# Patient Record
Sex: Male | Born: 1985 | Race: Black or African American | Hispanic: No | Marital: Single | Smoking: Former smoker
Health system: Southern US, Community
[De-identification: ages and names within clinical notes are randomized; demographics above are authoritative.]

## PROBLEM LIST (undated history)

## (undated) DIAGNOSIS — F329 Major depressive disorder, single episode, unspecified: Secondary | ICD-10-CM

## (undated) DIAGNOSIS — I10 Essential (primary) hypertension: Secondary | ICD-10-CM

## (undated) DIAGNOSIS — J45909 Unspecified asthma, uncomplicated: Secondary | ICD-10-CM

## (undated) DIAGNOSIS — F32A Depression, unspecified: Secondary | ICD-10-CM

---

## 2007-11-07 ENCOUNTER — Emergency Department: Payer: Self-pay | Admitting: Internal Medicine

## 2008-08-04 ENCOUNTER — Emergency Department: Payer: Self-pay | Admitting: Emergency Medicine

## 2008-12-27 ENCOUNTER — Emergency Department: Payer: Self-pay | Admitting: Emergency Medicine

## 2008-12-28 ENCOUNTER — Emergency Department: Payer: Self-pay | Admitting: Emergency Medicine

## 2009-03-03 ENCOUNTER — Emergency Department: Payer: Self-pay | Admitting: Emergency Medicine

## 2009-12-07 ENCOUNTER — Emergency Department: Payer: Self-pay | Admitting: Emergency Medicine

## 2010-01-02 ENCOUNTER — Emergency Department: Payer: Self-pay | Admitting: Emergency Medicine

## 2010-04-29 IMAGING — CR DG CHEST 2V
1 series · 2 of 2 positions shown · non-contrast
Comparison: none

REASON FOR EXAM: Chest pain
COMMENTS:

PROCEDURE:     DXR - DXR CHEST PA (OR AP) AND LATERAL  - November 07, 2007  [DATE]
RESULT:     The lung fields are clear. No pneumonia, pneumothorax or pleural
effusion is seen. The heart size is normal. The mediastinal and osseous
structures show no acute changes.

[Series 1: view not recorded · 0.17mm/px · 2 of 2 slices shown]
[im 1/2]
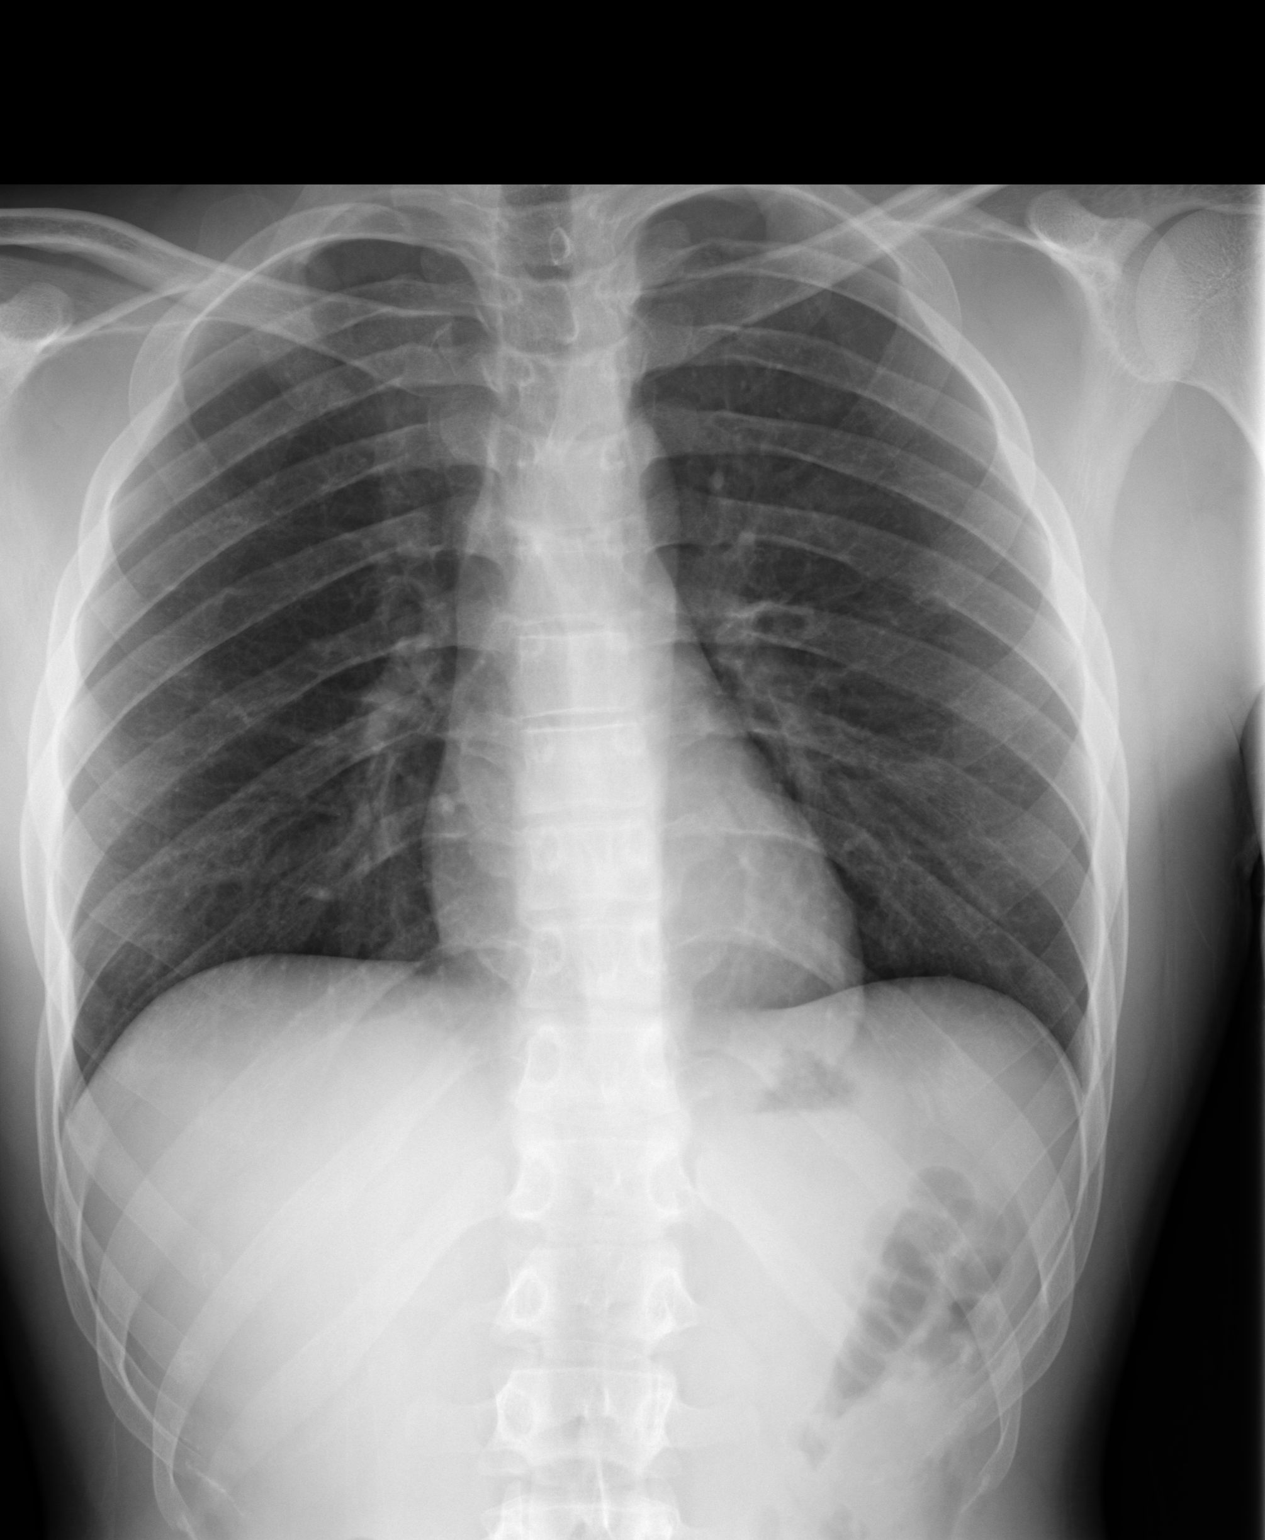
[im 2/2]
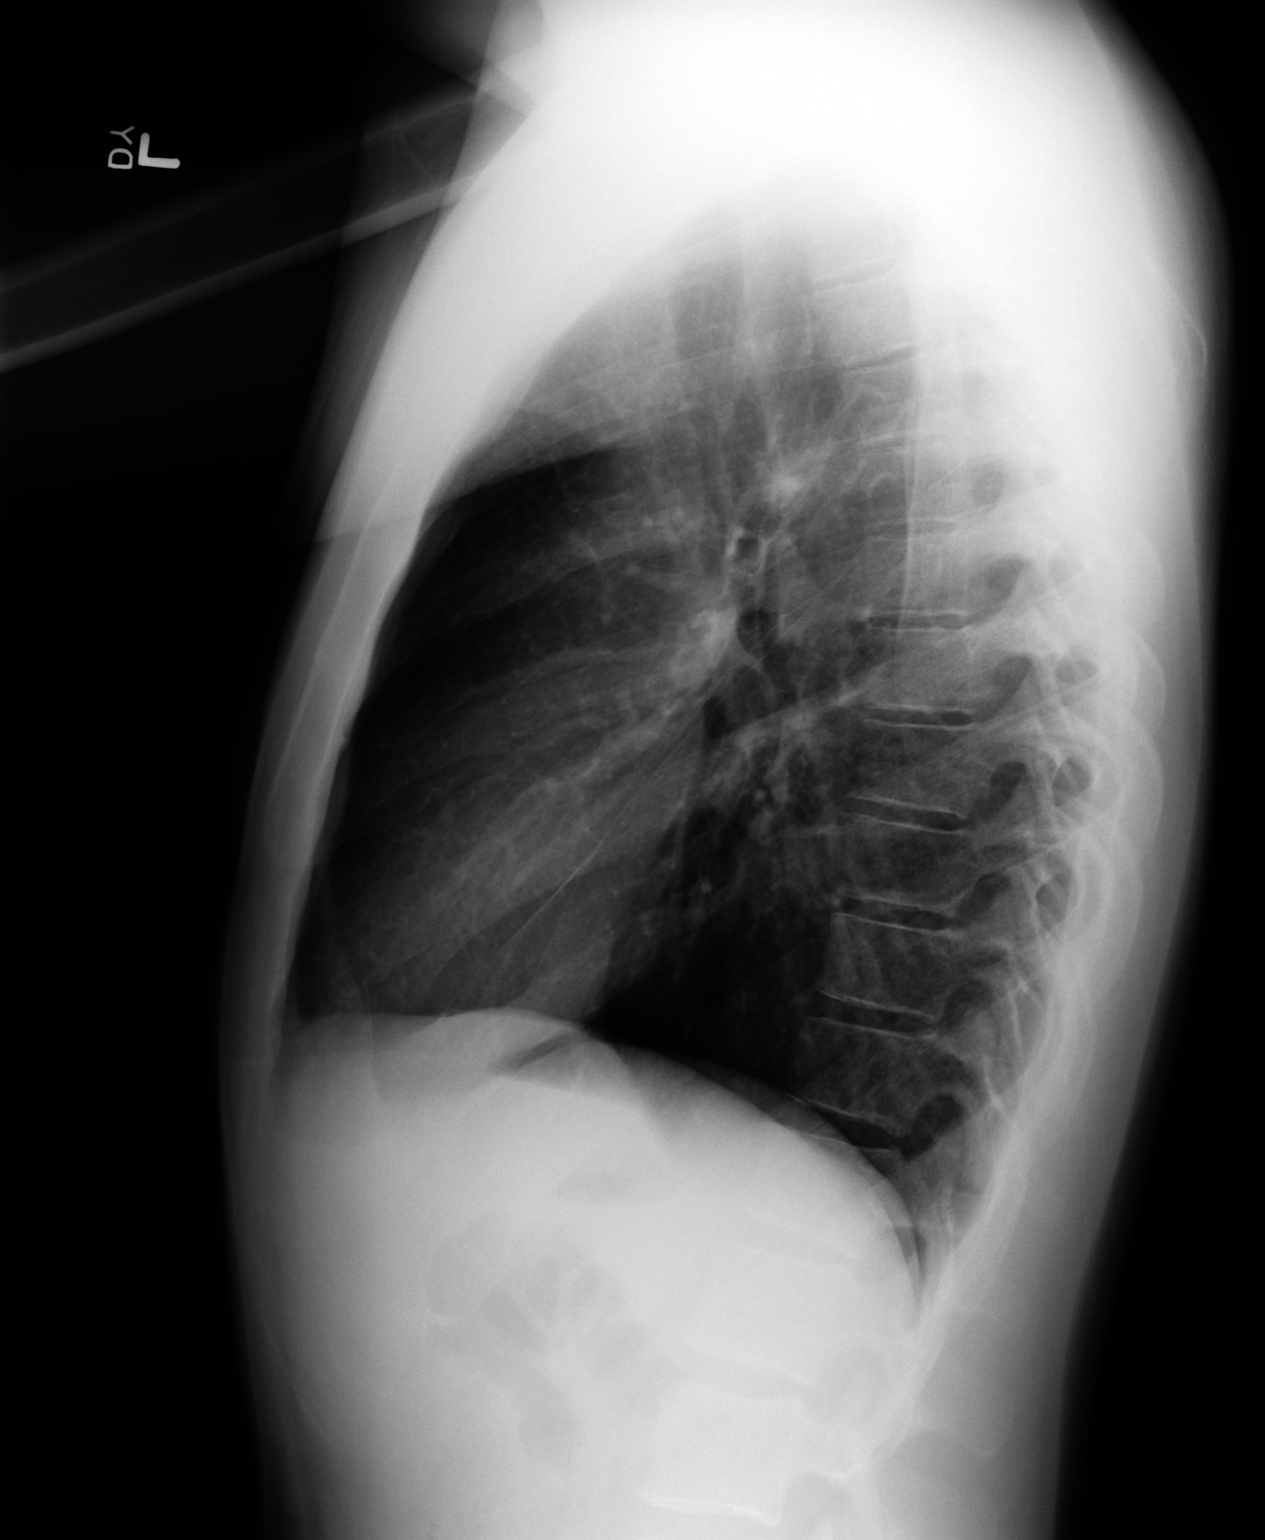

[2 of 2 positions shown; findings below may reference images not displayed]

IMPRESSION: No acute changes are identified.

## 2010-04-29 IMAGING — CR RIGHT ANKLE - COMPLETE 3+ VIEW
1 series · 5 of 5 positions shown · non-contrast
Comparison: none

REASON FOR EXAM: Fall, pain
COMMENTS:

PROCEDURE:     DXR - DXR ANKLE RIGHT COMPLETE  - November 07, 2007  [DATE]
RESULT:     No fracture, dislocation or other acute bony abnormality is
identified. The ankle mortise is well maintained.

[Series 1: view not recorded · 0.17mm/px · 5 of 5 slices shown]
[im 1/5]
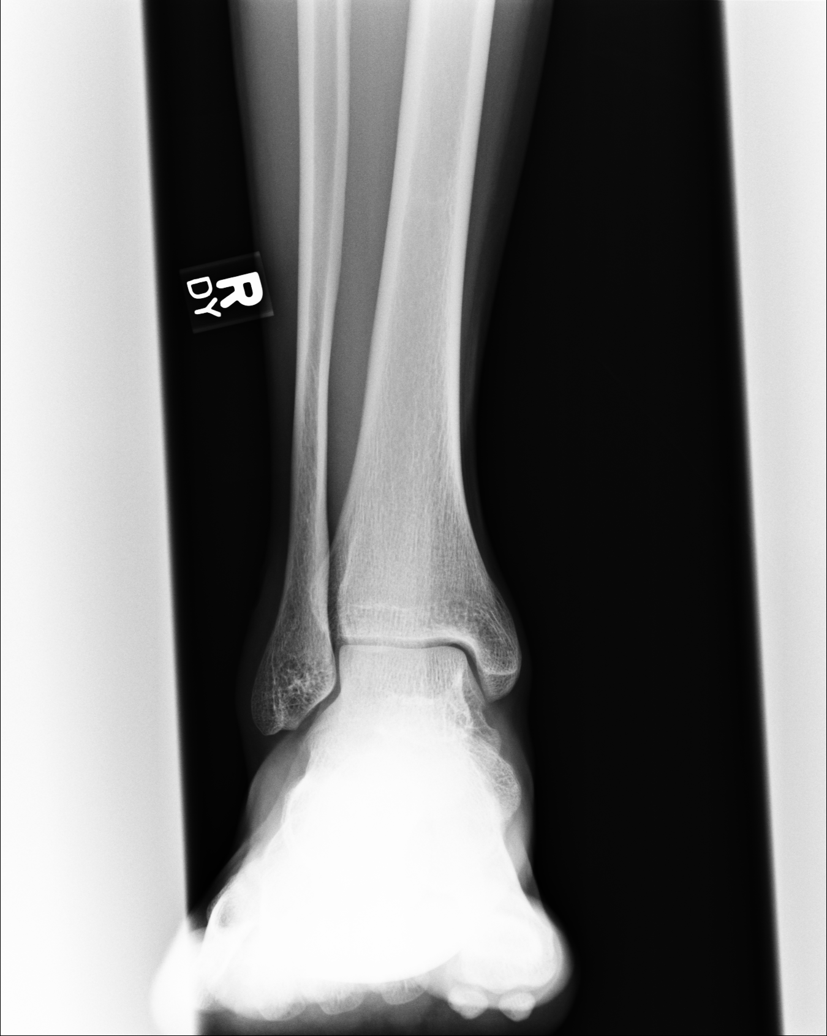
[im 2/5]
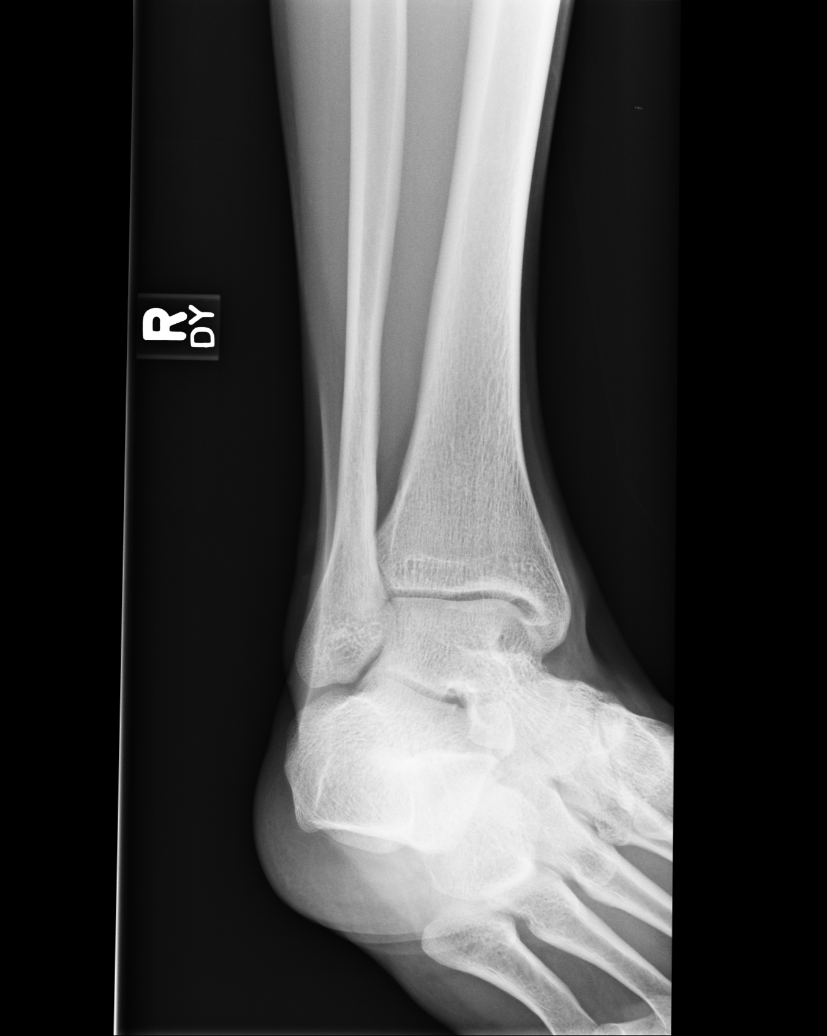
[im 3/5]
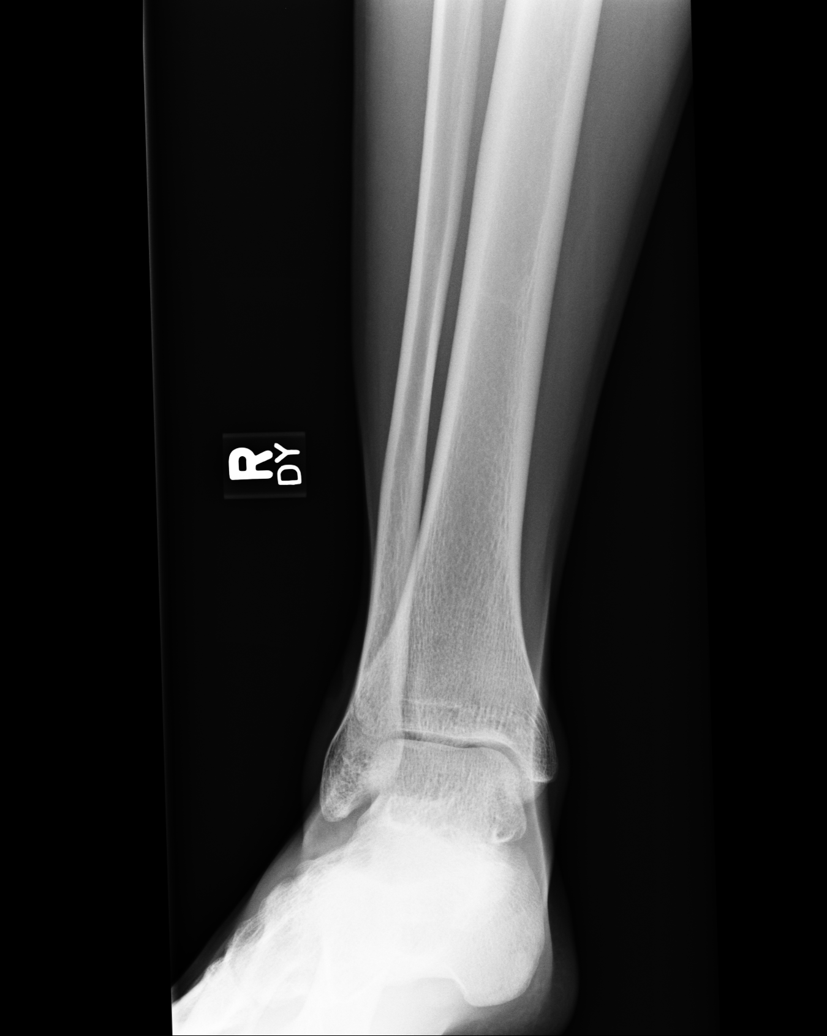
[im 4/5]
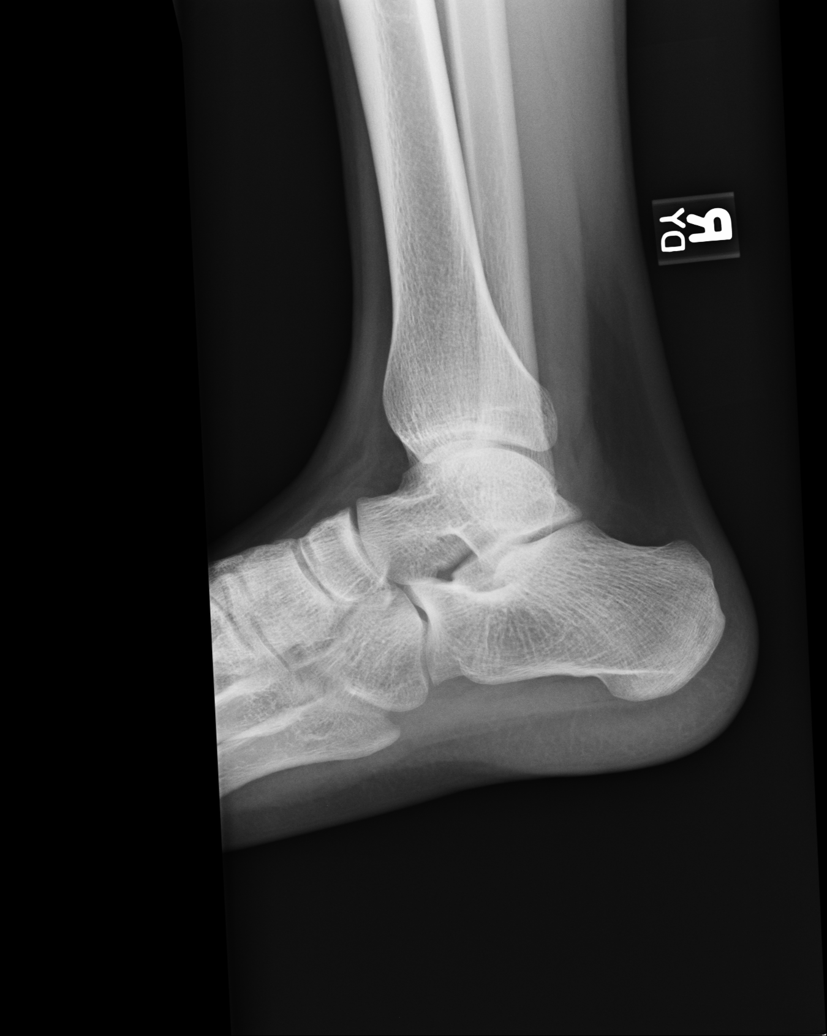
[im 5/5]
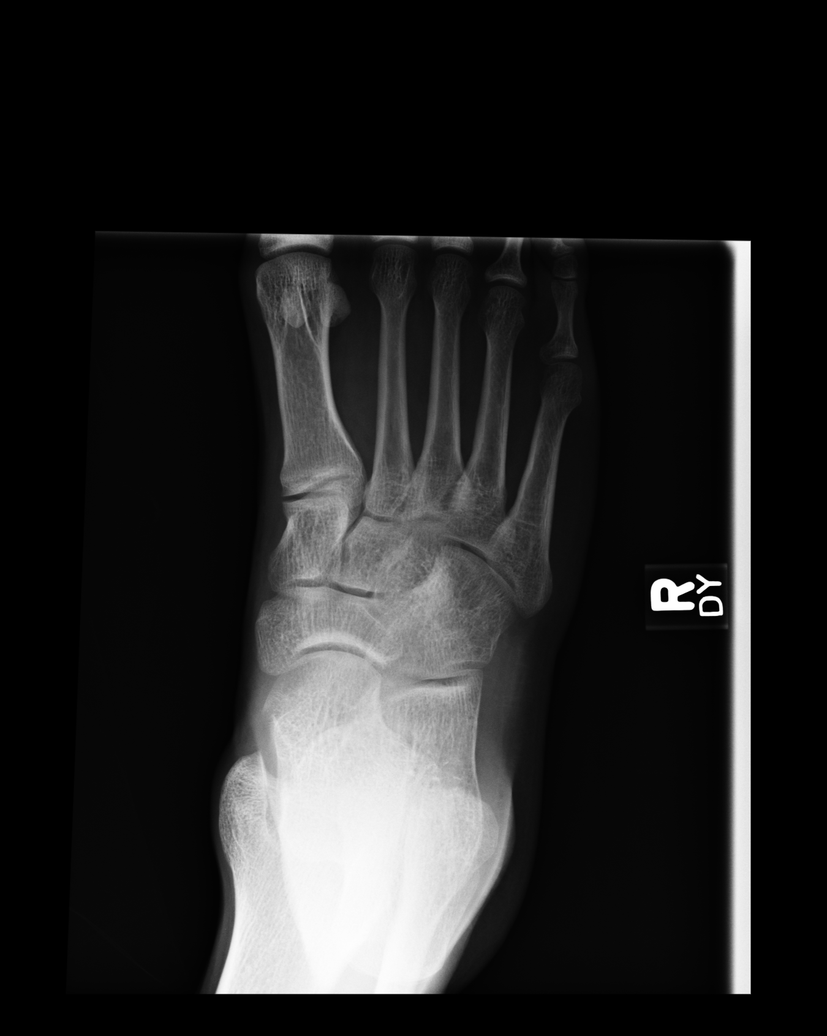

[5 of 5 positions shown; findings below may reference images not displayed]

IMPRESSION: No acute changes are identified.

## 2010-05-22 ENCOUNTER — Emergency Department: Payer: Self-pay | Admitting: Emergency Medicine

## 2010-12-07 ENCOUNTER — Emergency Department: Payer: Self-pay | Admitting: Emergency Medicine

## 2011-07-09 ENCOUNTER — Emergency Department: Payer: Self-pay | Admitting: *Deleted

## 2013-04-08 ENCOUNTER — Emergency Department: Payer: Self-pay | Admitting: Emergency Medicine

## 2013-04-08 LAB — URINALYSIS, COMPLETE
Bacteria: NONE SEEN
Bilirubin,UR: NEGATIVE
Blood: NEGATIVE
Glucose,UR: NEGATIVE mg/dL (ref 0–75)
Ketone: NEGATIVE
LEUKOCYTE ESTERASE: NEGATIVE
Nitrite: NEGATIVE
PROTEIN: NEGATIVE
Ph: 8 (ref 4.5–8.0)
RBC,UR: <1 /HPF (ref 0–5)
SQUAMOUS EPITHELIAL: NONE SEEN
Specific Gravity: 1.01 (ref 1.003–1.030)
WBC UR: 1 /HPF (ref 0–5)

## 2013-04-08 LAB — CBC WITH DIFFERENTIAL/PLATELET
BASOS PCT: 0.6 %
Basophil #: 0.1 10*3/uL (ref 0.0–0.1)
Eosinophil #: 0.2 10*3/uL (ref 0.0–0.7)
Eosinophil %: 2.2 %
HCT: 43.9 % (ref 40.0–52.0)
HGB: 15.1 g/dL (ref 13.0–18.0)
LYMPHS PCT: 26.2 %
Lymphocyte #: 2.6 10*3/uL (ref 1.0–3.6)
MCH: 30.1 pg (ref 26.0–34.0)
MCHC: 34.5 g/dL (ref 32.0–36.0)
MCV: 87 fL (ref 80–100)
Monocyte #: 0.8 x10 3/mm (ref 0.2–1.0)
Monocyte %: 7.5 %
Neutrophil #: 6.4 10*3/uL (ref 1.4–6.5)
Neutrophil %: 63.5 %
Platelet: 137 10*3/uL — ABNORMAL LOW (ref 150–440)
RBC: 5.02 10*6/uL (ref 4.40–5.90)
RDW: 13.1 % (ref 11.5–14.5)
WBC: 10 10*3/uL (ref 3.8–10.6)

## 2013-04-08 LAB — BASIC METABOLIC PANEL
Anion Gap: 3 — ABNORMAL LOW (ref 7–16)
BUN: 12 mg/dL (ref 7–18)
Calcium, Total: 9 mg/dL (ref 8.5–10.1)
Chloride: 104 mmol/L (ref 98–107)
Co2: 28 mmol/L (ref 21–32)
Creatinine: 0.93 mg/dL (ref 0.60–1.30)
EGFR (African American): >60
EGFR (Non-African Amer.): >60
Glucose: 110 mg/dL — ABNORMAL HIGH (ref 65–99)
Osmolality: 270 (ref 275–301)
Potassium: 3.7 mmol/L (ref 3.5–5.1)
Sodium: 135 mmol/L — ABNORMAL LOW (ref 136–145)

## 2013-06-08 ENCOUNTER — Emergency Department: Payer: Self-pay | Admitting: Emergency Medicine

## 2013-06-08 LAB — URINALYSIS, COMPLETE
Bacteria: NONE SEEN
Bilirubin,UR: NEGATIVE
Blood: NEGATIVE
GLUCOSE, UR: NEGATIVE mg/dL (ref 0–75)
Ketone: NEGATIVE
Leukocyte Esterase: NEGATIVE
Nitrite: NEGATIVE
PROTEIN: NEGATIVE
Ph: 8 (ref 4.5–8.0)
RBC,UR: <1 /HPF (ref 0–5)
Specific Gravity: 1.015 (ref 1.003–1.030)

## 2013-06-08 LAB — BASIC METABOLIC PANEL
Anion Gap: 7 (ref 7–16)
BUN: 16 mg/dL (ref 7–18)
CO2: 26 mmol/L (ref 21–32)
CREATININE: 0.91 mg/dL (ref 0.60–1.30)
Calcium, Total: 9.1 mg/dL (ref 8.5–10.1)
Chloride: 105 mmol/L (ref 98–107)
EGFR (African American): >60
EGFR (Non-African Amer.): >60
GLUCOSE: 97 mg/dL (ref 65–99)
Osmolality: 277 (ref 275–301)
POTASSIUM: 3.9 mmol/L (ref 3.5–5.1)
SODIUM: 138 mmol/L (ref 136–145)

## 2013-06-08 LAB — CBC
HCT: 45.6 % (ref 40.0–52.0)
HGB: 16.1 g/dL (ref 13.0–18.0)
MCH: 31.6 pg (ref 26.0–34.0)
MCHC: 35.3 g/dL (ref 32.0–36.0)
MCV: 90 fL (ref 80–100)
Platelet: 128 10*3/uL — ABNORMAL LOW (ref 150–440)
RBC: 5.09 10*6/uL (ref 4.40–5.90)
RDW: 12.7 % (ref 11.5–14.5)
WBC: 9.6 10*3/uL (ref 3.8–10.6)

## 2013-10-05 ENCOUNTER — Emergency Department: Payer: Self-pay | Admitting: Emergency Medicine

## 2013-12-11 ENCOUNTER — Emergency Department: Payer: Self-pay | Admitting: Emergency Medicine

## 2015-08-28 ENCOUNTER — Emergency Department
Admission: EM | Admit: 2015-08-28 | Discharge: 2015-08-28 | Disposition: A | Discharge status: Home / Self Care | Payer: Self-pay | Attending: Emergency Medicine | Admitting: Emergency Medicine

## 2015-08-28 ENCOUNTER — Encounter: Payer: Self-pay | Admitting: Medical Oncology

## 2015-08-28 DIAGNOSIS — K644 Residual hemorrhoidal skin tags: Secondary | ICD-10-CM | POA: Insufficient documentation

## 2015-08-28 DIAGNOSIS — K6289 Other specified diseases of anus and rectum: Secondary | ICD-10-CM

## 2015-08-28 MED ORDER — HYDROCODONE-ACETAMINOPHEN 5-325 MG PO TABS: 1.0000 | ORAL_TABLET | Freq: Three times a day (TID) | ORAL | Status: DC | PRN

## 2015-08-28 MED ORDER — DIBUCAINE 1 % RE OINT: 1.0000 "application " | TOPICAL_OINTMENT | Freq: Three times a day (TID) | RECTAL | Status: DC | PRN

## 2015-08-28 MED ORDER — HYDROCORTISONE ACETATE 25 MG RE SUPP: 25.0000 mg | Freq: Two times a day (BID) | RECTAL | Status: AC

## 2015-08-28 NOTE — ED Notes (Signed)
Pt reports lower back pain x 2 days without injury.  

## 2015-08-28 NOTE — Discharge Instructions (Signed)
Hemorrhoids °Hemorrhoids are swollen veins around the rectum or anus. There are two types of hemorrhoids:  °· Internal hemorrhoids. These occur in the veins just inside the rectum. They may poke through to the outside and become irritated and painful. °· External hemorrhoids. These occur in the veins outside the anus and can be felt as a painful swelling or hard lump near the anus. °CAUSES °· Pregnancy.   °· Obesity.   °· Constipation or diarrhea.   °· Straining to have a bowel movement.   °· Sitting for long periods on the toilet. °· Heavy lifting or other activity that caused you to strain. °· Anal intercourse. °SYMPTOMS  °· Pain.   °· Anal itching or irritation.   °· Rectal bleeding.   °· Fecal leakage.   °· Anal swelling.   °· One or more lumps around the anus.   °DIAGNOSIS  °Your caregiver may be able to diagnose hemorrhoids by visual examination. Other examinations or tests that may be performed include:  °· Examination of the rectal area with a gloved hand (digital rectal exam).   °· Examination of anal canal using a small tube (scope).   °· A blood test if you have lost a significant amount of blood. °· A test to look inside the colon (sigmoidoscopy or colonoscopy). °TREATMENT °Most hemorrhoids can be treated at home. However, if symptoms do not seem to be getting better or if you have a lot of rectal bleeding, your caregiver may perform a procedure to help make the hemorrhoids get smaller or remove them completely. Possible treatments include:  °· Placing a rubber band at the base of the hemorrhoid to cut off the circulation (rubber band ligation).   °· Injecting a chemical to shrink the hemorrhoid (sclerotherapy).   °· Using a tool to burn the hemorrhoid (infrared light therapy).   °· Surgically removing the hemorrhoid (hemorrhoidectomy).   °· Stapling the hemorrhoid to block blood flow to the tissue (hemorrhoid stapling).   °HOME CARE INSTRUCTIONS  °· Eat foods with fiber, such as whole grains, beans,  nuts, fruits, and vegetables. Ask your doctor about taking products with added fiber in them (fiber supplements). °· Increase fluid intake. Drink enough water and fluids to keep your urine clear or pale yellow.   °· Exercise regularly.   °· Go to the bathroom when you have the urge to have a bowel movement. Do not wait.   °· Avoid straining to have bowel movements.   °· Keep the anal area dry and clean. Use wet toilet paper or moist towelettes after a bowel movement.   °· Medicated creams and suppositories may be used or applied as directed.   °· Only take over-the-counter or prescription medicines as directed by your caregiver.   °· Take warm sitz baths for 15-20 minutes, 3-4 times a day to ease pain and discomfort.   °· Place ice packs on the hemorrhoids if they are tender and swollen. Using ice packs between sitz baths may be helpful.   °¨ Put ice in a plastic bag.   °¨ Place a towel between your skin and the bag.   °¨ Leave the ice on for 15-20 minutes, 3-4 times a day.   °· Do not use a donut-shaped pillow or sit on the toilet for long periods. This increases blood pooling and pain.   °SEEK MEDICAL CARE IF: °· You have increasing pain and swelling that is not controlled by treatment or medicine. °· You have uncontrolled bleeding. °· You have difficulty or you are unable to have a bowel movement. °· You have pain or inflammation outside the area of the hemorrhoids. °MAKE SURE YOU: °· Understand these instructions. °·   Will watch your condition. °· Will get help right away if you are not doing well or get worse. °  °This information is not intended to replace advice given to you by your health care provider. Make sure you discuss any questions you have with your health care provider. °  °Document Released: 03/19/2000 Document Revised: 03/08/2012 Document Reviewed: 01/25/2012 °Elsevier Interactive Patient Education ©2016 [employer] ° °High-Fiber Diet °Fiber, also called dietary fiber, is a type of carbohydrate  found in fruits, vegetables, whole grains, and beans. A high-fiber diet can have many health benefits. Your health care provider may recommend a high-fiber diet to help: °· Prevent constipation. Fiber can make your bowel movements more regular. °· Lower your cholesterol. °· Relieve hemorrhoids, uncomplicated diverticulosis, or irritable bowel syndrome. °· Prevent overeating as part of a weight-loss plan. °· Prevent heart disease, type 2 diabetes, and certain cancers. °WHAT IS MY PLAN? °The recommended daily intake of fiber includes: °· 38 grams for men under age 50. °· 30 grams for men over age 50. °· 25 grams for women under age 50. °· 21 grams for women over age 50. °You can get the recommended daily intake of dietary fiber by eating a variety of fruits, vegetables, grains, and beans. Your health care provider may also recommend a fiber supplement if it is not possible to get enough fiber through your diet. °WHAT DO I NEED TO KNOW ABOUT A HIGH-FIBER DIET? °· Fiber supplements have not been widely studied for their effectiveness, so it is better to get fiber through food sources. °· Always check the fiber content on the nutrition facts label of any prepackaged food. Look for foods that contain at least 5 grams of fiber per serving. °· Ask your dietitian if you have questions about specific foods that are related to your condition, especially if those foods are not listed in the following section. °· Increase your daily fiber consumption gradually. Increasing your intake of dietary fiber too quickly may cause bloating, cramping, or gas. °· Drink plenty of water. Water helps you to digest fiber. °WHAT FOODS CAN I EAT? °Grains °Whole-grain breads. Multigrain cereal. Oats and oatmeal. Brown rice. Barley. Bulgur wheat. Millet. Bran muffins. Popcorn. Rye wafer crackers. °Vegetables °Sweet potatoes. Spinach. Kale. Artichokes. Cabbage. Broccoli. Green peas. Carrots. Squash. °Fruits °Berries. Pears. Apples. Oranges.  Avocados. Prunes and raisins. Dried figs. °Meats and Other Protein Sources °Navy, kidney, pinto, and soy beans. Split peas. Lentils. Nuts and seeds. °Dairy °Fiber-fortified yogurt. °Beverages °Fiber-fortified soy milk. Fiber-fortified orange juice. °Other °Fiber bars. °The items listed above may not be a complete list of recommended foods or beverages. Contact your dietitian for more options. °WHAT FOODS ARE NOT RECOMMENDED? °Grains °White bread. Pasta made with refined flour. White rice. °Vegetables °Fried potatoes. Canned vegetables. Well-cooked vegetables.  °Fruits °Fruit juice. Cooked, strained fruit. °Meats and Other Protein Sources °Fatty cuts of meat. Fried poultry or fried fish. °Dairy °Milk. Yogurt. Cream cheese. Sour cream. °Beverages °Soft drinks. °Other °Cakes and pastries. Butter and oils. °The items listed above may not be a complete list of foods and beverages to avoid. Contact your dietitian for more information. °WHAT ARE SOME TIPS FOR INCLUDING HIGH-FIBER FOODS IN MY DIET? °· Eat a wide variety of high-fiber foods. °· Make sure that half of all grains consumed each day are whole grains. °· Replace breads and cereals made from refined flour or white flour with whole-grain breads and cereals. °· Replace white rice with brown rice, bulgur wheat, or millet. °· Start   the day with a breakfast that is high in fiber, such as a cereal that contains at least 5 grams of fiber per serving.  Use beans in place of meat in soups, salads, or pasta.  Eat high-fiber snacks, such as berries, raw vegetables, nuts, or popcorn.   This information is not intended to replace advice given to you by your health care provider. Make sure you discuss any questions you have with your health care provider.   Document Released: 03/22/2005 Document Revised: 04/12/2014 Document Reviewed: 09/04/2013 Elsevier Interactive Patient Education 2016 [employer]   Use the prescription meds as directed. Start a daily stool  softener (Miralax) or fiber supplement (Metamucil, Fibercon, etc.) [facility] with  [facility] as needed.

## 2015-08-28 NOTE — ED Notes (Signed)
States he developed pain to lower back area.  Denies any injury.. Also noticed a possible abscess area to buttock areas

## 2015-08-28 NOTE — ED Provider Notes (Signed)
[facility] Emergency Department Provider Note ____________________________________________  Time seen: 1811  I have reviewed the triage vital signs and the nursing notes.  HISTORY  Chief Complaint  Back Pain  HPI Ivan Estrada is a 30 y.o. male visits to the ED for evaluation of pain to the rectum that has been intermittent for several months.He describes the last several days he's had increased tenderness and pain to the rectum is increased with stool. He has also noticed some intermittent bright red blood on the toilet tissue. He denies any abdominal pain, nausea, vomiting, diarrhea. He does note some firm stools intermittently. He is without any fevers, chills, sweats. He reports his pain at 10/10 intermittently.  History reviewed. No pertinent past medical history.  There are no active problems to display for this patient.  No past surgical history on file.  Current Outpatient Rx  Name  Route  Sig  Dispense  Refill  . dibucaine (NUPERCAINAL) 1 % OINT   Rectal   Place 1 application rectally 3 (three) times daily as needed for pain or hemorrhoids.   30 g   0   . HYDROcodone-acetaminophen (NORCO) 5-325 MG tablet   Oral   Take 1 tablet by mouth every 8 (eight) hours as needed for moderate pain.   10 tablet   0   . hydrocortisone (ANUSOL-HC) 25 MG suppository   Rectal   Place 1 suppository (25 mg total) rectally every 12 (twelve) hours.   12 suppository   1     Allergies Review of patient's allergies indicates no known allergies.  No family history on file.  Social History Social History  Substance Use Topics  . Smoking status: None  . Smokeless tobacco: None  . Alcohol Use: None   Review of Systems  Constitutional: Negative for fever. Gastrointestinal: Negative for abdominal pain, vomiting and diarrhea. Rectal pain as above.  Genitourinary: Negative for dysuria. Musculoskeletal: Negative for back pain. Skin: Negative for  rash. Neurological: Negative for headaches, focal weakness or numbness. ____________________________________________  PHYSICAL EXAM:  VITAL SIGNS: ED Triage Vitals  Enc Vitals Group     BP 08/28/15 1744 143/93 mmHg     Pulse Rate 08/28/15 1744 88     Resp 08/28/15 1744 17     Temp 08/28/15 1744 98.7 F (37.1 C)     Temp Source 08/28/15 1744 Oral     SpO2 08/28/15 1744 98 %     Weight 08/28/15 1744 180 lb (81.647 kg)     Height 08/28/15 1744 5\' 8"  (1.727 m)     Head Cir --      Peak Flow --      Pain Score 08/28/15 1744 10     Pain Loc --      Pain Edu? --      Excl. in GC? --     Constitutional: Alert and oriented. Well appearing and in no distress. Head: Normocephalic and atraumatic. Respiratory: Normal respiratory effort.  Gastrointestinal: Soft and nontender. No distention. Rectal exam reveals a single, external hemorrhoid without thrombosis, strangulation, or bleeding  Musculoskeletal: Nontender with normal range of motion in all extremities.  Neurologic:  Normal gait without ataxia. Normal speech and language. No gross focal neurologic deficits are appreciated. Skin:  Skin is warm, dry and intact. No rash noted. ____________________________________________  INITIAL IMPRESSION / ASSESSMENT AND PLAN / ED COURSE  Patient with acute rectal pain secondary to incisional abscess. He'll be discharged with prescriptions for Dupercaine, Anusol suppositories, and Norco as  needed for pain. He is given instruction on high-fiber diet and stool softeners for pain relief. ____________________________________________  FINAL CLINICAL IMPRESSION(S) / ED DIAGNOSES  Final diagnoses:  Anal or rectal pain  External hemorrhoid     Lissa Hoard, PA-C 08/28/15 1831  Phineas Semen, MD 08/29/15 [phone]

## 2015-09-16 ENCOUNTER — Emergency Department
Admission: EM | Admit: 2015-09-16 | Discharge: 2015-09-16 | Disposition: A | Discharge status: Home / Self Care | Payer: Self-pay | Attending: Emergency Medicine | Admitting: Emergency Medicine

## 2015-09-16 ENCOUNTER — Encounter: Payer: Self-pay | Admitting: Emergency Medicine

## 2015-09-16 DIAGNOSIS — L02411 Cutaneous abscess of right axilla: Secondary | ICD-10-CM | POA: Insufficient documentation

## 2015-09-16 DIAGNOSIS — Z87891 Personal history of nicotine dependence: Secondary | ICD-10-CM | POA: Insufficient documentation

## 2015-09-16 MED ORDER — LIDOCAINE HCL (PF) 1 % IJ SOLN
INTRAMUSCULAR | Status: AC
  Filled 2015-09-16: qty 5

## 2015-09-16 MED ORDER — OXYCODONE-ACETAMINOPHEN 7.5-325 MG PO TABS: 1.0000 | ORAL_TABLET | ORAL | Status: DC | PRN

## 2015-09-16 MED ORDER — SULFAMETHOXAZOLE-TRIMETHOPRIM 800-160 MG PO TABS: 1.0000 | ORAL_TABLET | Freq: Two times a day (BID) | ORAL | Status: DC

## 2015-09-16 NOTE — ED Provider Notes (Signed)
[facility] Emergency Department Provider Note   ____________________________________________  Time seen: Approximately 4:18 PM  I have reviewed the triage vital signs and the nursing notes.   HISTORY  Chief Complaint Abscess    HPI Ivan Estrada is a 30 y.o. male complaining of a raised area on the right axillary area. Patient has increased the last 2-3 days. Patient stated area is draining. Patient has applied warm compresses to the area along with apple cider Vinegar. No other palliative measures for this complaint. Patient rates his pain discomfort as 8/10.   History reviewed. No pertinent past medical history.  There are no active problems to display for this patient.   History reviewed. No pertinent past surgical history.  Current Outpatient Rx  Name  Route  Sig  Dispense  Refill  . dibucaine (NUPERCAINAL) 1 % OINT   Rectal   Place 1 application rectally 3 (three) times daily as needed for pain or hemorrhoids.   30 g   0   . HYDROcodone-acetaminophen (NORCO) 5-325 MG tablet   Oral   Take 1 tablet by mouth every 8 (eight) hours as needed for moderate pain.   10 tablet   0   . oxyCODONE-acetaminophen (PERCOCET) 7.5-325 MG tablet   Oral   Take 1 tablet by mouth every 4 (four) hours as needed for severe pain.   20 tablet   0   . sulfamethoxazole-trimethoprim (BACTRIM DS,SEPTRA DS) 800-160 MG tablet   Oral   Take 1 tablet by mouth 2 (two) times daily.   20 tablet   0     Allergies Review of patient's allergies indicates no known allergies.  No family history on file.  Social History Social History  Substance Use Topics  . Smoking status: Former Smoker    Types: Cigarettes  . Smokeless tobacco: None  . Alcohol Use: No    Review of Systems Constitutional: No fever/chills Eyes: No visual changes. ENT: No sore throat. Cardiovascular: Denies chest pain. Respiratory: Denies shortness of breath. Gastrointestinal: No  abdominal pain.  No nausea, no vomiting.  No diarrhea.  No constipation. Genitourinary: Negative for dysuria. Musculoskeletal: Negative for back pain. Skin: Nodule lesion right axillary area. Neurological: Negative for headaches, focal weakness or numbness.   ____________________________________________   PHYSICAL EXAM:  VITAL SIGNS: ED Triage Vitals  Enc Vitals Group     BP 09/16/15 1612 134/81 mmHg     Pulse Rate 09/16/15 1612 69     Resp 09/16/15 1612 16     Temp 09/16/15 1612 97.8 F (36.6 C)     Temp Source 09/16/15 1612 Oral     SpO2 09/16/15 1612 98 %     Weight 09/16/15 1612 190 lb (86.183 kg)     Height 09/16/15 1612 5\' 10"  (1.778 m)     Head Cir --      Peak Flow --      Pain Score 09/16/15 1614 8     Pain Loc --      Pain Edu? --      Excl. in GC? --    Constitutional: Alert and oriented. Well appearing and in no acute distress. Eyes: Conjunctivae are normal. PERRL. EOMI. Head: Atraumatic. Nose: No congestion/rhinnorhea. Mouth/Throat: Mucous membranes are moist.  Oropharynx non-erythematous. Neck: No stridor.  No cervical spine tenderness to palpation. Hematological/Lymphatic/Immunilogical: No cervical lymphadenopathy. Cardiovascular: Normal rate, regular rhythm. Grossly normal heart sounds.  Good peripheral circulation. Respiratory: Normal respiratory effort.  No retractions. Lungs CTAB. Gastrointestinal: Soft  and nontender. No distention. No abdominal bruits. No CVA tenderness. Musculoskeletal: No lower extremity tenderness nor edema.  No joint effusions. Neurologic:  Normal speech and language. No gross focal neurologic deficits are appreciated. No gait instability. Skin: Erythematous edematous Nodule lesion right axillary area  Psychiatric: Mood and affect are normal. Speech and behavior are normal.  ____________________________________________   LABS (all labs ordered are listed, but only abnormal results are displayed)  Labs Reviewed - No data to  display ____________________________________________  EKG   ____________________________________________  RADIOLOGY   ____________________________________________   PROCEDURES  Procedure(s) performed: INCISION AND DRAINAGE INCISION AND DRAINAGE Performed by: Joni Reining Consent: Verbal consent obtained. Risks and benefits: risks, benefits and alternatives were discussed Type: abscess  Body area: Right axillary area  Anesthesia: local infiltration  Incision was made with a scalpel.  Local anesthetic: lidocaine 1% without epinephrine  Anesthetic total: 4 ml  Complexity: complex Blunt dissection to break up loculations  Drainage: purulent  Drainage amount: Large   Packing material: 1/4 in iodoform gauze  Patient tolerance: Patient tolerated the procedure well with no immediate complications.    INCISION AND DRAINAGE  Critical Care performed: No  ____________________________________________   INITIAL IMPRESSION / ASSESSMENT AND PLAN / ED COURSE  Pertinent labs & imaging results that were available during my care of the patient were reviewed by me and considered in my medical decision making (see chart for details).  Abscess right axillary area. Patient given discharge Instructions. Patient given prescription for Percocet and Bactrim DS. Patient was terminated 2 days for reevaluation. ____________________________________________   FINAL CLINICAL IMPRESSION(S) / ED DIAGNOSES  Final diagnoses:  Abscess of axilla, right      NEW MEDICATIONS STARTED DURING THIS VISIT:  New Prescriptions   OXYCODONE-ACETAMINOPHEN (PERCOCET) 7.5-325 MG TABLET    Take 1 tablet by mouth every 4 (four) hours as needed for severe pain.   SULFAMETHOXAZOLE-TRIMETHOPRIM (BACTRIM DS,SEPTRA DS) 800-160 MG TABLET    Take 1 tablet by mouth 2 (two) times daily.     Note:  This document was prepared using Dragon voice recognition software and may include unintentional dictation  errors.    Joni Reining, PA-C 09/16/15 1718  Sharman Cheek, MD 09/16/15 [phone]

## 2015-09-16 NOTE — ED Notes (Signed)
States noticed a bump to right axilla.  Bump has worsened over the past couple of days.  Draining purlent drainage.

## 2015-09-16 NOTE — ED Notes (Signed)
States he developed a raised area under right arm 2-3 days ago  Unsure if it is an abscess or insect bite  Area is draining

## 2015-09-18 ENCOUNTER — Encounter: Payer: Self-pay | Admitting: *Deleted

## 2015-09-18 ENCOUNTER — Emergency Department
Admission: EM | Admit: 2015-09-18 | Discharge: 2015-09-18 | Disposition: A | Discharge status: Home / Self Care | Payer: Self-pay | Attending: Emergency Medicine | Admitting: Emergency Medicine

## 2015-09-18 DIAGNOSIS — Z87891 Personal history of nicotine dependence: Secondary | ICD-10-CM | POA: Insufficient documentation

## 2015-09-18 DIAGNOSIS — Z4800 Encounter for change or removal of nonsurgical wound dressing: Secondary | ICD-10-CM | POA: Insufficient documentation

## 2015-09-18 DIAGNOSIS — Z09 Encounter for follow-up examination after completed treatment for conditions other than malignant neoplasm: Secondary | ICD-10-CM

## 2015-09-18 NOTE — ED Provider Notes (Signed)
[facility] Emergency Department Provider Note  ____________________________________________  Time seen: Approximately 8:20 AM  I have reviewed the triage vital signs and the nursing notes.   HISTORY  Chief Complaint Wound Check    HPI Ivan Estrada is a 30 y.o. male , NAD, presents to the emergency department for recheck of abscess under the right axilla that was incised and drained 2 days ago. States he has been taking his antibiotics as previously prescribed without side effects. States he's been completely warm compresses under the right axilla over the last couple of days. Denies any fevers, chills, body aches. Has not had any active oozing or weeping at the incision site. Has not completed any dressing changes since incision and drainage was completed. Does note decreased pain in the area.   History reviewed. No pertinent past medical history.  There are no active problems to display for this patient.   History reviewed. No pertinent past surgical history.  Current Outpatient Rx  Name  Route  Sig  Dispense  Refill  . dibucaine (NUPERCAINAL) 1 % OINT   Rectal   Place 1 application rectally 3 (three) times daily as needed for pain or hemorrhoids.   30 g   0   . HYDROcodone-acetaminophen (NORCO) 5-325 MG tablet   Oral   Take 1 tablet by mouth every 8 (eight) hours as needed for moderate pain.   10 tablet   0   . oxyCODONE-acetaminophen (PERCOCET) 7.5-325 MG tablet   Oral   Take 1 tablet by mouth every 4 (four) hours as needed for severe pain.   20 tablet   0   . sulfamethoxazole-trimethoprim (BACTRIM DS,SEPTRA DS) 800-160 MG tablet   Oral   Take 1 tablet by mouth 2 (two) times daily.   20 tablet   0     Allergies Review of patient's allergies indicates no known allergies.  History reviewed. No pertinent family history.  Social History Social History  Substance Use Topics  . Smoking status: Former Smoker    Types:  Cigarettes  . Smokeless tobacco: None  . Alcohol Use: No     Review of Systems  Constitutional: No fever/chills ENT: No Swelling about lips, tongue, throat. Cardiovascular: No chest pain. Respiratory: No shortness of breath. No wheezing.  Gastrointestinal: No abdominal pain.  No nausea, vomiting.  No diarrhea.   Musculoskeletal: Negative for Right upper extremity pain.  Skin: Positive incised abscess under the right axilla. Negative for rash. Neurological: Negative for headaches, focal weakness or numbness. No tingling 10-point ROS otherwise negative.  ____________________________________________   PHYSICAL EXAM:  VITAL SIGNS: ED Triage Vitals  Enc Vitals Group     BP 09/18/15 0808 124/77 mmHg     Pulse Rate 09/18/15 0808 73     Resp 09/18/15 0808 18     Temp 09/18/15 0808 98.1 F (36.7 C)     Temp Source 09/18/15 0808 Oral     SpO2 09/18/15 0808 98 %     Weight 09/18/15 0808 195 lb (88.451 kg)     Height 09/18/15 0808 5\' 10"  (1.778 m)     Head Cir --      Peak Flow --      Pain Score 09/18/15 0807 3     Pain Loc --      Pain Edu? --      Excl. in GC? --      Constitutional: Alert and oriented. Well appearing and in no acute distress. Eyes: Conjunctivae are normal.  Head: Atraumatic. Respiratory: Normal respiratory effort without tachypnea or retractions. Musculoskeletal: No tenderness to palpation about the right upper extremity. Full range of motion of the right shoulder is noted without pain. Neurologic:  Normal speech and language. No gross focal neurologic deficits are appreciated.  Skin:  Incised abscess noted to have about the right axilla with packing in place. No active oozing or weeping. Packing was removed without adverse effect and anterior wound with good granulation and no active oozing or weeping. Skin is warm, dry. No rash noted. Psychiatric: Mood and affect are normal. Speech and behavior are normal. Patient exhibits appropriate insight and  judgement.   ____________________________________________   LABS  None ____________________________________________  EKG  None ____________________________________________  RADIOLOGY  None ____________________________________________    PROCEDURES  Procedure(s) performed: None    Medications - No data to display   ____________________________________________   INITIAL IMPRESSION / ASSESSMENT AND PLAN / ED COURSE  Patient's diagnosis is consistent with encounter for recheck of abscess following incision and drainage. Patient will be discharged home with instructions to continue antibiotics to completion as previously prescribed. Patient is to follow up with [facility] community clinic if symptoms persist past this treatment course. Patient is given ED precautions to return to the ED for any worsening or new symptoms.      ____________________________________________  FINAL CLINICAL IMPRESSION(S) / ED DIAGNOSES  Final diagnoses:  Encounter for recheck of abscess following incision and drainage      NEW MEDICATIONS STARTED DURING THIS VISIT:  New Prescriptions   No medications on file         Hope Pigeon, PA-C 09/18/15 0825  Jene Every, MD 09/18/15 1559

## 2015-09-18 NOTE — ED Notes (Signed)
States he had an abscess drained 2 days ago in his right arm pit and is here for a recheck

## 2015-09-18 NOTE — Discharge Instructions (Signed)
Wound Care Taking care of your wound properly can help to prevent pain and infection. It can also help your wound to heal more quickly.  HOW TO CARE FOR YOUR WOUND  Take or apply over-the-counter and prescription medicines only as told by your health care provider.  If you were prescribed antibiotic medicine, take or apply it as told by your health care provider. Do not stop using the antibiotic even if your condition improves.  Clean the wound each day or as told by your health care provider.  Wash the wound with mild soap and water.  Rinse the wound with water to remove all soap.  Pat the wound dry with a clean towel. Do not rub it.  There are many different ways to close and cover a wound. For example, a wound can be covered with stitches (sutures), skin glue, or adhesive strips. Follow instructions from your health care provider about:  How to take care of your wound.  When and how you should change your bandage (dressing).  When you should remove your dressing.  Removing whatever was used to close your wound.  Check your wound every day for signs of infection. Watch for:  Redness, swelling, or pain.  Fluid, blood, or pus.  Keep the dressing dry until your health care provider says it can be removed. Do not take baths, swim, use a hot tub, or do anything that would put your wound underwater until your health care provider approves.  Raise (elevate) the injured area above the level of your heart while you are sitting or lying down.  Do not scratch or pick at the wound.  Keep all follow-up visits as told by your health care provider. This is important. SEEK MEDICAL CARE IF:  You received a tetanus shot and you have swelling, severe pain, redness, or bleeding at the injection site.  You have a fever.  Your pain is not controlled with medicine.  You have increased redness, swelling, or pain at the site of your wound.  You have fluid, blood, or pus coming from your  wound.  You notice a bad smell coming from your wound or your dressing. SEEK IMMEDIATE MEDICAL CARE IF:  You have a red streak going away from your wound.   This information is not intended to replace advice given to you by your health care provider. Make sure you discuss any questions you have with your health care provider.   Document Released: 12/30/2007 Document Revised: 08/06/2014 Document Reviewed: 03/18/2014 Elsevier Interactive Patient Education 2016 [employer]   Incision and Drainage, Care After  Refer to this sheet in the next few weeks. These instructions provide you with information on caring for yourself after your procedure. Your caregiver may also give you more specific instructions. Your treatment has been planned according to current medical practices, but problems sometimes occur. Call your caregiver if you have any problems or questions after your procedure.  HOME CARE INSTRUCTIONS  If antibiotic medicine is given, take it as directed. Finish it even if you start to feel better.  Only take over-the-counter or prescription medicines for pain, discomfort, or fever as directed by your caregiver.  Keep all follow-up appointments as directed by your caregiver.  Change any bandages (dressings) as directed by your caregiver. Replace old dressings with clean dressings.  Wash your hands before and after caring for your wound. You will receive specific instructions for cleansing and caring for your wound.  SEEK MEDICAL CARE IF:  You have increased  pain, swelling, or redness around the wound.  You have increased drainage, smell, or bleeding from the wound.  You have muscle aches, chills, or you feel generally sick.  You have a fever. MAKE SURE YOU:  Understand these instructions.  Will watch your condition.  Will get help right away if you are not doing well or get worse. This information is not intended to replace advice given to you by your health care provider. Make sure  you discuss any questions you have with your health care provider.  Document Released: 06/14/2011 Document Revised: 04/12/2014 Document Reviewed: 06/14/2011  Elsevier Interactive Patient Education [employer]

## 2017-06-15 ENCOUNTER — Other Ambulatory Visit: Payer: Self-pay

## 2017-06-15 ENCOUNTER — Emergency Department
Admission: EM | Admit: 2017-06-15 | Discharge: 2017-06-15 | Disposition: A | Discharge status: Home / Self Care | Payer: Self-pay | Attending: Emergency Medicine | Admitting: Emergency Medicine

## 2017-06-15 ENCOUNTER — Encounter: Payer: Self-pay | Admitting: Emergency Medicine

## 2017-06-15 DIAGNOSIS — Z87891 Personal history of nicotine dependence: Secondary | ICD-10-CM | POA: Insufficient documentation

## 2017-06-15 DIAGNOSIS — Y999 Unspecified external cause status: Secondary | ICD-10-CM | POA: Insufficient documentation

## 2017-06-15 DIAGNOSIS — W57XXXA Bitten or stung by nonvenomous insect and other nonvenomous arthropods, initial encounter: Secondary | ICD-10-CM | POA: Insufficient documentation

## 2017-06-15 DIAGNOSIS — S90561A Insect bite (nonvenomous), right ankle, initial encounter: Secondary | ICD-10-CM | POA: Insufficient documentation

## 2017-06-15 DIAGNOSIS — Y939 Activity, unspecified: Secondary | ICD-10-CM | POA: Insufficient documentation

## 2017-06-15 DIAGNOSIS — L03115 Cellulitis of right lower limb: Secondary | ICD-10-CM | POA: Insufficient documentation

## 2017-06-15 DIAGNOSIS — Y929 Unspecified place or not applicable: Secondary | ICD-10-CM | POA: Insufficient documentation

## 2017-06-15 DIAGNOSIS — Z79899 Other long term (current) drug therapy: Secondary | ICD-10-CM | POA: Insufficient documentation

## 2017-06-15 MED ORDER — IBUPROFEN 800 MG PO TABS
800.0000 mg | ORAL_TABLET | Freq: Once | ORAL | Status: AC
  Administered 2017-06-15: 800 mg via ORAL
  Filled 2017-06-15: qty 1

## 2017-06-15 MED ORDER — SULFAMETHOXAZOLE-TRIMETHOPRIM 800-160 MG PO TABS
1.0000 | ORAL_TABLET | Freq: Once | ORAL | Status: AC
  Administered 2017-06-15: 1 via ORAL
  Filled 2017-06-15: qty 1

## 2017-06-15 MED ORDER — IBUPROFEN 800 MG PO TABS: 800.0000 mg | ORAL_TABLET | Freq: Three times a day (TID) | ORAL | 0 refills | Status: DC | PRN

## 2017-06-15 MED ORDER — SULFAMETHOXAZOLE-TRIMETHOPRIM 800-160 MG PO TABS: 1.0000 | ORAL_TABLET | Freq: Two times a day (BID) | ORAL | 0 refills | Status: DC

## 2017-06-15 NOTE — ED Triage Notes (Signed)
Pt here for insect bite to right calf.  Outside and runs in to spiders a lot and thinks a spider bit him. Redness noted.  No fevers.  Pain with walking.

## 2017-06-15 NOTE — ED Provider Notes (Signed)
[facility] EMERGENCY DEPARTMENT Provider Note   CSN: [id number] Arrival date & time: 06/15/17  1808     History   Chief Complaint Chief Complaint  Patient presents with  . Insect Bite    HPI Ivan Estrada is a 32 y.o. male presents to the emergency department for evaluation of insect bite to the right lower leg.  Patient states he noticed the area of redness and tenderness yesterday.  He has had some swelling that is mild that is localized along the right lateral ankle insect bite.  There is been no drainage.  He denies any fevers.  His pain is moderate.  He has not had any medications for pain.  He has been ambulatory with no assistive devices.  HPI  History reviewed. No pertinent past medical history.  There are no active problems to display for this patient.   History reviewed. No pertinent surgical history.     Home Medications    Prior to Admission medications   Medication Sig Start Date End Date Taking? Authorizing Provider  dibucaine (NUPERCAINAL) 1 % OINT Place 1 application rectally 3 (three) times daily as needed for pain or hemorrhoids. 08/28/15   Menshew, Charlesetta Ivory, PA-C  HYDROcodone-acetaminophen (NORCO) 5-325 MG tablet Take 1 tablet by mouth every 8 (eight) hours as needed for moderate pain. 08/28/15   Menshew, Charlesetta Ivory, PA-C  ibuprofen (ADVIL,MOTRIN) 800 MG tablet Take 1 tablet (800 mg total) by mouth every 8 (eight) hours as needed. 06/15/17   Evon Slack, PA-C  oxyCODONE-acetaminophen (PERCOCET) 7.5-325 MG tablet Take 1 tablet by mouth every 4 (four) hours as needed for severe pain. 09/16/15   Joni Reining, PA-C  sulfamethoxazole-trimethoprim (BACTRIM DS,SEPTRA DS) 800-160 MG tablet Take 1 tablet by mouth 2 (two) times daily. 06/15/17   Evon Slack, PA-C    Family History History reviewed. No pertinent family history.  Social History Social History   Tobacco Use  . Smoking status: Former Smoker    Types:  Cigarettes  . Smokeless tobacco: Never Used  Substance Use Topics  . Alcohol use: No  . Drug use: No     Allergies   Patient has no known allergies.   Review of Systems Review of Systems  Constitutional: Negative for fever.  Respiratory: Negative for shortness of breath.   Cardiovascular: Negative for chest pain.  Musculoskeletal: Negative for arthralgias.  Skin: Positive for rash and wound.     Physical Exam Updated Vital Signs BP (!) 134/94 (BP Location: Left Arm)   Pulse 100   Temp 98.6 F (37 C) (Oral)   Resp 20   Ht 5\' 11"  (1.803 m)   Wt 83.9 kg (185 lb)   SpO2 98%   BMI 25.80 kg/m   Physical Exam  Constitutional: He is oriented to person, place, and time. He appears well-developed and well-nourished.  HENT:  Head: Normocephalic and atraumatic.  Eyes: Conjunctivae are normal.  Neck: Normal range of motion.  Cardiovascular: Normal rate.  Pulmonary/Chest: Effort normal. No respiratory distress.  Musculoskeletal:  Examination of the right lateral lower leg shows just above the lateral malleolus there is a 4 cm diameter area of erythema with a centralized papule.  There is no fluctuance or drainage noted.  Tissue was soft and with minimal induration.  Patient is mildly tender to palpation.  There is no other swelling warmth or tenderness throughout the leg.  All swelling erythema and warmth is very localized to the area of insect  bite.  Negative Homans sign bilaterally.  Neurological: He is alert and oriented to person, place, and time.  Skin: Skin is warm. No rash noted. There is erythema.  Psychiatric: He has a normal mood and affect. His behavior is normal. Thought content normal.     ED Treatments / Results  Labs (all labs ordered are listed, but only abnormal results are displayed) Labs Reviewed - No data to display  EKG  EKG Interpretation None       Radiology No results found.  Procedures Procedures (including critical care  time)  Medications Ordered in ED Medications  sulfamethoxazole-trimethoprim (BACTRIM DS,SEPTRA DS) 800-160 MG per tablet 1 tablet (1 tablet Oral Given 06/15/17 1849)  ibuprofen (ADVIL,MOTRIN) tablet 800 mg (800 mg Oral Given 06/15/17 1849)     Initial Impression / Assessment and Plan / ED Course  I have reviewed the triage vital signs and the nursing notes.  Pertinent labs & imaging results that were available during my care of the patient were reviewed by me and considered in my medical decision making (see chart for details).     32 year old male with localized cellulitis to the right lower lateral leg.  Wound is consistent with insect bite with localized cellulitis.  No fluctuance or abscess formation present.  Patient started on Bactrim DS and will take Tylenol and ibuprofen for pain.  He is educated on warm compresses.  He is educated on signs symptoms return to the ED for.  Patient with no other swelling or edema throughout bilateral lower extremities.  Final Clinical Impressions(s) / ED Diagnoses   Final diagnoses:  Insect bite, initial encounter  Cellulitis of right lower extremity    ED Discharge Orders        Ordered    sulfamethoxazole-trimethoprim (BACTRIM DS,SEPTRA DS) 800-160 MG tablet  2 times daily     06/15/17 1910    ibuprofen (ADVIL,MOTRIN) 800 MG tablet  Every 8 hours PRN     06/15/17 1910       Ronnette JuniperGaines, Thomas C, PA-C 06/15/17 1926    Minna AntisPaduchowski, Kevin, MD 06/15/17 2259

## 2017-06-15 NOTE — Discharge Instructions (Signed)
Please take antibiotics as prescribed.  Use Tylenol and ibuprofen as needed for pain.  Apply warm compresses to the right ankle.  Return to the ER for any increasing pain, swelling, redness, drainage, fevers, worsening symptoms or urgent changes in your health.

## 2018-01-13 ENCOUNTER — Other Ambulatory Visit: Payer: Self-pay

## 2018-01-13 ENCOUNTER — Encounter: Payer: Self-pay | Admitting: Emergency Medicine

## 2018-01-13 ENCOUNTER — Emergency Department
Admission: EM | Admit: 2018-01-13 | Discharge: 2018-01-13 | Disposition: A | Discharge status: Home / Self Care | Payer: Self-pay | Attending: Emergency Medicine | Admitting: Emergency Medicine

## 2018-01-13 DIAGNOSIS — L02415 Cutaneous abscess of right lower limb: Secondary | ICD-10-CM | POA: Insufficient documentation

## 2018-01-13 DIAGNOSIS — Y999 Unspecified external cause status: Secondary | ICD-10-CM | POA: Insufficient documentation

## 2018-01-13 DIAGNOSIS — Z87891 Personal history of nicotine dependence: Secondary | ICD-10-CM | POA: Insufficient documentation

## 2018-01-13 DIAGNOSIS — Y939 Activity, unspecified: Secondary | ICD-10-CM | POA: Insufficient documentation

## 2018-01-13 DIAGNOSIS — L0291 Cutaneous abscess, unspecified: Secondary | ICD-10-CM

## 2018-01-13 DIAGNOSIS — W57XXXA Bitten or stung by nonvenomous insect and other nonvenomous arthropods, initial encounter: Secondary | ICD-10-CM | POA: Insufficient documentation

## 2018-01-13 DIAGNOSIS — Y929 Unspecified place or not applicable: Secondary | ICD-10-CM | POA: Insufficient documentation

## 2018-01-13 MED ORDER — FENTANYL CITRATE (PF) 100 MCG/2ML IJ SOLN
100.0000 ug | Freq: Once | INTRAMUSCULAR | Status: AC
  Administered 2018-01-13: 100 ug via NASAL
  Filled 2018-01-13: qty 2

## 2018-01-13 MED ORDER — LIDOCAINE HCL (PF) 1 % IJ SOLN
5.0000 mL | Freq: Once | INTRAMUSCULAR | Status: AC
  Administered 2018-01-13: 5 mL via INTRADERMAL
  Filled 2018-01-13: qty 5

## 2018-01-13 NOTE — ED Notes (Signed)
Clean sterile dressing placed on abscess per MD order.

## 2018-01-13 NOTE — ED Provider Notes (Signed)
[facility] Emergency Department Provider Note  ____________________________________________   First MD Initiated Contact with Patient 01/13/18 0430     (approximate)  I have reviewed the triage vital signs and the nursing notes.   HISTORY  Chief Complaint Insect Bite    HPI Ivan Estrada is a 32 y.o. male self presents to the emergency department with 2 or 3 days of a painful mass to his right upper thigh.  He feels that it began as a spider bite however has slowly progressed.  The pain is now moderate to severe.  Worse with movement improved with rest.  No fevers or chills.  No history of the same.    History reviewed. No pertinent past medical history.  There are no active problems to display for this patient.   History reviewed. No pertinent surgical history.  Prior to Admission medications   Medication Sig Start Date End Date Taking? Authorizing Provider  dibucaine (NUPERCAINAL) 1 % OINT Place 1 application rectally 3 (three) times daily as needed for pain or hemorrhoids. 08/28/15   Menshew, Charlesetta Ivory, PA-C  HYDROcodone-acetaminophen (NORCO) 5-325 MG tablet Take 1 tablet by mouth every 8 (eight) hours as needed for moderate pain. 08/28/15   Menshew, Charlesetta Ivory, PA-C  ibuprofen (ADVIL,MOTRIN) 800 MG tablet Take 1 tablet (800 mg total) by mouth every 8 (eight) hours as needed. 06/15/17   Evon Slack, PA-C  oxyCODONE-acetaminophen (PERCOCET) 7.5-325 MG tablet Take 1 tablet by mouth every 4 (four) hours as needed for severe pain. 09/16/15   Joni Reining, PA-C  sulfamethoxazole-trimethoprim (BACTRIM DS,SEPTRA DS) 800-160 MG tablet Take 1 tablet by mouth 2 (two) times daily. 06/15/17   Evon Slack, PA-C    Allergies Patient has no known allergies.  No family history on file.  Social History Social History   Tobacco Use  . Smoking status: Former Smoker    Types: Cigarettes  . Smokeless tobacco: Never Used  Substance Use  Topics  . Alcohol use: No  . Drug use: No    Review of Systems Constitutional: No fever/chills Cardiovascular: Denies chest pain. Respiratory: Denies shortness of breath. Gastrointestinal: No abdominal pain.  No nausea, no vomiting.   Skin: Positive for wound    ____________________________________________   PHYSICAL EXAM:  VITAL SIGNS: ED Triage Vitals  Enc Vitals Group     BP      Pulse      Resp      Temp      Temp src      SpO2      Weight      Height      Head Circumference      Peak Flow      Pain Score      Pain Loc      Pain Edu?      Excl. in GC?     Constitutional: Alert and oriented x4 appears obviously uncomfortable nontoxic no diaphoresis Cardiovascular: Regular rate and rhythm Respiratory: Normal respiratory effort.  No retractions. Neurologic:  Normal speech and language. No gross focal neurologic deficits are appreciated.  Skin: 5 cm abscess to medial right upper thigh with no overlying cellulitis    ____________________________________________  LABS (all labs ordered are listed, but only abnormal results are displayed)  Labs Reviewed - No data to display   __________________________________________  EKG   ____________________________________________  RADIOLOGY   ____________________________________________   DIFFERENTIAL includes but not limited to  Abscess, cellulitis, insect bite, sebaceous  cyst   PROCEDURES  Procedure(s) performed: Yes  .Marland KitchenIncision and Drainage Date/Time: 01/13/2018 5:00 AM Performed by: Merrily Brittle, MD Authorized by: Merrily Brittle, MD   Consent:    Consent obtained:  Verbal   Consent given by:  Patient   Risks discussed:  Bleeding, damage to other organs, incomplete drainage, infection and pain   Alternatives discussed:  Alternative treatment Location:    Type:  Abscess   Size:  5cm   Location:  Lower extremity   Lower extremity location:  Leg   Leg location:  R upper  leg Pre-procedure details:    Skin preparation:  Chloraprep Sedation:    Sedation type:  Anxiolysis Procedure type:    Complexity:  Complex Procedure details:    Needle aspiration: no     Incision types:  Single straight   Scalpel blade:  11   Drainage:  Purulent   Drainage amount:  Moderate   Wound treatment:  Wound left open   Packing materials:  None Post-procedure details:    Patient tolerance of procedure:  Tolerated well, no immediate complications    Critical Care performed: no  ____________________________________________   INITIAL IMPRESSION / ASSESSMENT AND PLAN / ED COURSE  Pertinent labs & imaging results that were available during my care of the patient were reviewed by me and considered in my medical decision making (see chart for details).   As part of my medical decision making, I reviewed the following data within the electronic MEDICAL RECORD NUMBER History obtained from family if available, nursing notes, old chart and ekg, as well as notes from prior ED visits.  The patient presents with an indurated fluctuant mass to his medial upper right thigh which is consistent with abscess.  He is not driving home so I gave him 100 mcg of intranasal fentanyl with improvement in his symptoms.  I then used chlorhexidine to cleanse the wound and used 1% lidocaine to anesthetize the area.  I then used an 11 blade to make a straight incision expressing a large amount of purulent material.  All loculations broken up.  The patient tolerated procedure well.  There is no overlying cellulitis no antibiotics are indicated.  2-day wound check given.  Strict return precautions have been given.      ____________________________________________   FINAL CLINICAL IMPRESSION(S) / ED DIAGNOSES  Final diagnoses:  Abscess      NEW MEDICATIONS STARTED DURING THIS VISIT:  Discharge Medication List as of 01/13/2018  5:40 AM       Note:  This document was prepared using Dragon voice  recognition software and may include unintentional dictation errors.      Merrily Brittle, MD 01/15/18 [phone]

## 2018-01-13 NOTE — ED Notes (Signed)
Reviewed discharge instructions, follow-up care, and wound care with patient. Patient verbalized understanding of all information reviewed. Patient stable, with no distress noted at this time.    

## 2018-01-13 NOTE — ED Triage Notes (Signed)
Patient to ER for c/o "spider bite" to right inner thigh a few days ago. States he did not see spider bite him, but killed a spider in his room same day he noticed "bite". Patient states he has been bitten in past by brown recluse and area to leg looks similar. Patient denies any known fevers.

## 2018-01-13 NOTE — Discharge Instructions (Signed)
Please make sure you wash out your incision daily with warm soapy water and then place a bandage keep it completely dry.  Is normal to take a week or 2 for it to fully heal up.  Return to the emergency department if you develop any fevers, chills, worsening pain, or for any other issues whatsoever.  It was a pleasure to take care of you today, and thank you for coming to our emergency department.  If you have any questions or concerns before leaving please ask the nurse to grab me and I'm more than happy to go through your aftercare instructions again.  If you were prescribed any opioid pain medication today such as Norco, Vicodin, Percocet, morphine, hydrocodone, or oxycodone please make sure you do not drive when you are taking this medication as it can alter your ability to drive safely.  If you have any concerns once you are home that you are not improving or are in fact getting worse before you can make it to your follow-up appointment, please do not hesitate to call [phone] and come back for further evaluation.  Merrily Brittle, MD  While here in the ER today you received very powerful medicine that makes it unsafe for you to drive for the rest of the day.  Do not drive until tomorrow.

## 2018-04-25 ENCOUNTER — Encounter: Payer: Self-pay | Admitting: Emergency Medicine

## 2018-04-25 ENCOUNTER — Emergency Department
Admission: EM | Admit: 2018-04-25 | Discharge: 2018-04-25 | Disposition: A | Discharge status: Home / Self Care | Payer: Self-pay | Attending: Emergency Medicine | Admitting: Emergency Medicine

## 2018-04-25 ENCOUNTER — Emergency Department: Payer: Self-pay

## 2018-04-25 ENCOUNTER — Other Ambulatory Visit: Payer: Self-pay

## 2018-04-25 DIAGNOSIS — Z87891 Personal history of nicotine dependence: Secondary | ICD-10-CM | POA: Insufficient documentation

## 2018-04-25 DIAGNOSIS — I10 Essential (primary) hypertension: Secondary | ICD-10-CM | POA: Insufficient documentation

## 2018-04-25 DIAGNOSIS — J219 Acute bronchiolitis, unspecified: Secondary | ICD-10-CM | POA: Insufficient documentation

## 2018-04-25 DIAGNOSIS — J45909 Unspecified asthma, uncomplicated: Secondary | ICD-10-CM | POA: Insufficient documentation

## 2018-04-25 DIAGNOSIS — Z79899 Other long term (current) drug therapy: Secondary | ICD-10-CM | POA: Insufficient documentation

## 2018-04-25 DIAGNOSIS — R0981 Nasal congestion: Secondary | ICD-10-CM | POA: Insufficient documentation

## 2018-04-25 HISTORY — DX: Major depressive disorder, single episode, unspecified: F32.9

## 2018-04-25 HISTORY — DX: Essential (primary) hypertension: I10

## 2018-04-25 HISTORY — DX: Depression, unspecified: F32.A

## 2018-04-25 HISTORY — DX: Unspecified asthma, uncomplicated: J45.909

## 2018-04-25 MED ORDER — ALBUTEROL SULFATE HFA 108 (90 BASE) MCG/ACT IN AERS: 2.0000 | INHALATION_SPRAY | Freq: Four times a day (QID) | RESPIRATORY_TRACT | 2 refills | Status: AC | PRN

## 2018-04-25 MED ORDER — LISINOPRIL 10 MG PO TABS: 10.0000 mg | ORAL_TABLET | Freq: Every day | ORAL | 2 refills | Status: AC

## 2018-04-25 MED ORDER — AZITHROMYCIN 250 MG PO TABS: ORAL_TABLET | ORAL | 0 refills | Status: AC

## 2018-04-25 MED ORDER — BENZONATATE 200 MG PO CAPS: 200.0000 mg | ORAL_CAPSULE | Freq: Three times a day (TID) | ORAL | 0 refills | Status: AC | PRN

## 2018-04-25 NOTE — ED Provider Notes (Signed)
[facility] Emergency Department Provider Note  ____________________________________________   First MD Initiated Contact with Patient 04/25/18 [phone]     (approximate)  I have reviewed the triage vital signs and the nursing notes.   HISTORY  Chief Complaint Cough and Generalized Body Aches    HPI Ivan Estrada is a 33 y.o. male presents emergency department complaining of cough and congestion with yellow to green mucus.  Symptoms for several days.  Denies fever, chills, chest pain or shortness of breath.  Patient does have a history of asthma.  Patient states he has history of hypertension but has not had medication for his hypertension.   Past Medical History:  Diagnosis Date  . Asthma   . Depression   . Hypertension     There are no active problems to display for this patient.   History reviewed. No pertinent surgical history.  Prior to Admission medications   Medication Sig Start Date End Date Taking? Authorizing Provider  albuterol (PROVENTIL HFA;VENTOLIN HFA) 108 (90 Base) MCG/ACT inhaler Inhale 2 puffs into the lungs every 6 (six) hours as needed for wheezing or shortness of breath. 04/25/18   Fisher, Roselyn Bering, PA-C  azithromycin (ZITHROMAX Z-PAK) 250 MG tablet 2 pills today then 1 pill a day for 4 days 04/25/18   Sherrie Mustache Roselyn Bering, PA-C  benzonatate (TESSALON) 200 MG capsule Take 1 capsule (200 mg total) by mouth 3 (three) times daily as needed for cough. 04/25/18   Fisher, Roselyn Bering, PA-C  lisinopril (PRINIVIL,ZESTRIL) 10 MG tablet Take 1 tablet (10 mg total) by mouth daily. 04/25/18   Faythe Ghee, PA-C    Allergies Patient has no known allergies.  No family history on file.  Social History Social History   Tobacco Use  . Smoking status: Former Smoker    Types: Cigarettes  . Smokeless tobacco: Never Used  Substance Use Topics  . Alcohol use: No  . Drug use: No    Review of Systems  Constitutional: No fever/chills Eyes: No  visual changes. ENT: No sore throat. Respiratory: Positive cough and congestion, positive wheezing Genitourinary: Negative for dysuria. Musculoskeletal: Negative for back pain. Skin: Negative for rash.    ____________________________________________   PHYSICAL EXAM:  VITAL SIGNS: ED Triage Vitals  Enc Vitals Group     BP 04/25/18 0910 (!) 171/114     Pulse Rate 04/25/18 0910 (!) 103     Resp 04/25/18 0910 16     Temp 04/25/18 0910 98 F (36.7 C)     Temp Source 04/25/18 0910 Oral     SpO2 04/25/18 0910 98 %     Weight 04/25/18 0911 190 lb (86.2 kg)     Height 04/25/18 0911 5\' 10"  (1.778 m)     Head Circumference --      Peak Flow --      Pain Score 04/25/18 0911 7     Pain Loc --      Pain Edu? --      Excl. in GC? --     Constitutional: Alert and oriented. Well appearing and in no acute distress. Eyes: Conjunctivae are normal.  Head: Atraumatic. ENT: TMS clear bilaterally Nose: No congestion/rhinnorhea. Mouth/Throat: Mucous membranes are moist.   NECK: Is supple, no lymphadenopathy is noted  cardiovascular: Normal rate, regular rhythm.  Heart sounds are normal Respiratory: Normal respiratory effort.  No retractions, lungs clear to all station GU: deferred Musculoskeletal: FROM all extremities, warm and well perfused Neurologic:  Normal speech and  language.  Skin:  Skin is warm, dry and intact. No rash noted. Psychiatric: Mood and affect are normal. Speech and behavior are normal.  ____________________________________________   LABS (all labs ordered are listed, but only abnormal results are displayed)  Labs Reviewed - No data to display ____________________________________________   ____________________________________________  RADIOLOGY  Chest x-ray is negative  ____________________________________________   PROCEDURES  Procedure(s) performed: No  Procedures    ____________________________________________   INITIAL IMPRESSION /  ASSESSMENT AND PLAN / ED COURSE  Pertinent labs & imaging results that were available during my care of the patient were reviewed by me and considered in my medical decision making (see chart for details).   Patient is 33 year old male presents emergency department complaining of flulike symptoms.  Physical exam is basically unremarkable.  Chest x-ray is negative. Explained test results to the patient.  He is to follow-up with his regular doctor if not better in 3 to 5 days.  Due to the elevated blood pressure he was started on lisinopril 10 mg.  He was given a prescription for Z-Pak, Tessalon Perles, and albuterol inhaler.  He was also given a work note.  He is to return to emergency department worsening.  States he understands will comply.  Is discharged in stable condition.     As part of my medical decision making, I reviewed the following data within the electronic MEDICAL RECORD NUMBER Nursing notes reviewed and incorporated, Old chart reviewed, Radiograph reviewed chest x-ray is negative, Notes from prior ED visits and [place] Controlled Substance Database  ____________________________________________   FINAL CLINICAL IMPRESSION(S) / ED DIAGNOSES  Final diagnoses:  Acute bronchiolitis due to unspecified organism      NEW MEDICATIONS STARTED DURING THIS VISIT:  Discharge Medication List as of 04/25/2018 11:56 AM    START taking these medications   Details  azithromycin (ZITHROMAX Z-PAK) 250 MG tablet 2 pills today then 1 pill a day for 4 days, Normal    benzonatate (TESSALON) 200 MG capsule Take 1 capsule (200 mg total) by mouth 3 (three) times daily as needed for cough., Starting Tue 04/25/2018, Normal    lisinopril (PRINIVIL,ZESTRIL) 10 MG tablet Take 1 tablet (10 mg total) by mouth daily., Starting Tue 04/25/2018, Normal         Note:  This document was prepared using Dragon voice recognition software and may include unintentional dictation errors.     Faythe GheeFisher, Susan W,  PA-C 04/25/18 1321    Rockne MenghiniNorman, Anne-Caroline, MD 04/25/18 [phone]

## 2018-04-25 NOTE — Discharge Instructions (Addendum)
Follow-up with your regular doctor for reevaluation of your blood pressure after being on the medication for 3 weeks.  Return emergency department if symptoms worsening.  Take the antibiotic and cough medicine as prescribed.

## 2018-04-25 NOTE — ED Triage Notes (Signed)
Says sick for 2 weeks with cough that is very bad at night sometimes making him vomit after. Says he has had cough. And says feels achey all over.

## 2018-04-25 NOTE — ED Notes (Signed)
See triage note  Presents with weakness  And "feeling"bad  States he developed cough and some SOB  States he thinks it may be his asthma  States he has been out of his inhaler   Afebrile on arrival

## 2018-06-14 ENCOUNTER — Encounter: Payer: Self-pay | Admitting: Emergency Medicine

## 2018-06-14 ENCOUNTER — Emergency Department: Payer: No Typology Code available for payment source

## 2018-06-14 ENCOUNTER — Emergency Department
Admission: EM | Admit: 2018-06-14 | Discharge: 2018-06-14 | Disposition: A | Discharge status: Home / Self Care | Payer: No Typology Code available for payment source | Attending: Emergency Medicine | Admitting: Emergency Medicine

## 2018-06-14 ENCOUNTER — Other Ambulatory Visit: Payer: Self-pay

## 2018-06-14 DIAGNOSIS — Z87891 Personal history of nicotine dependence: Secondary | ICD-10-CM | POA: Insufficient documentation

## 2018-06-14 DIAGNOSIS — Y999 Unspecified external cause status: Secondary | ICD-10-CM | POA: Insufficient documentation

## 2018-06-14 DIAGNOSIS — J45909 Unspecified asthma, uncomplicated: Secondary | ICD-10-CM | POA: Insufficient documentation

## 2018-06-14 DIAGNOSIS — Z23 Encounter for immunization: Secondary | ICD-10-CM | POA: Diagnosis not present

## 2018-06-14 DIAGNOSIS — S8012XA Contusion of left lower leg, initial encounter: Secondary | ICD-10-CM

## 2018-06-14 DIAGNOSIS — Y9389 Activity, other specified: Secondary | ICD-10-CM | POA: Diagnosis not present

## 2018-06-14 DIAGNOSIS — I10 Essential (primary) hypertension: Secondary | ICD-10-CM | POA: Diagnosis not present

## 2018-06-14 DIAGNOSIS — Y92410 Unspecified street and highway as the place of occurrence of the external cause: Secondary | ICD-10-CM | POA: Insufficient documentation

## 2018-06-14 DIAGNOSIS — S8992XA Unspecified injury of left lower leg, initial encounter: Secondary | ICD-10-CM | POA: Diagnosis present

## 2018-06-14 MED ORDER — TRAMADOL HCL 50 MG PO TABS
50.0000 mg | ORAL_TABLET | Freq: Once | ORAL | Status: AC
  Administered 2018-06-14: 50 mg via ORAL
  Filled 2018-06-14: qty 1

## 2018-06-14 MED ORDER — TETANUS-DIPHTH-ACELL PERTUSSIS 5-2.5-18.5 LF-MCG/0.5 IM SUSP
0.5000 mL | Freq: Once | INTRAMUSCULAR | Status: AC
  Administered 2018-06-14: 0.5 mL via INTRAMUSCULAR
  Filled 2018-06-14: qty 0.5

## 2018-06-14 MED ORDER — HYDROCODONE-ACETAMINOPHEN 5-325 MG PO TABS: 1.0000 | ORAL_TABLET | Freq: Four times a day (QID) | ORAL | 0 refills | Status: AC | PRN
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
## 2018-06-14 NOTE — Discharge Instructions (Signed)
Follow-up with your primary care provider if any continued problems or [facility] acute care.  Take pain medication as directed only.  Your medication was sent to [facility] on [address].  Clean abrasion daily with mild soap and water.  Watch for any signs of infection.  Follow-up with urgent care if there is any signs of infection.  Return to the emergency department if any severe worsening of your symptoms or urgent concerns.

## 2021-05-27 ENCOUNTER — Ambulatory Visit: Payer: Self-pay | Admitting: Nurse Practitioner

## 2023-07-30 ENCOUNTER — Other Ambulatory Visit: Payer: Self-pay

## 2023-07-30 ENCOUNTER — Encounter: Payer: Self-pay | Admitting: Radiology

## 2023-07-30 ENCOUNTER — Emergency Department
Admission: EM | Admit: 2023-07-30 | Discharge: 2023-07-30 | Disposition: A | Discharge status: Home / Self Care | Attending: Emergency Medicine | Admitting: Emergency Medicine

## 2023-07-30 ENCOUNTER — Emergency Department

## 2023-07-30 DIAGNOSIS — R221 Localized swelling, mass and lump, neck: Secondary | ICD-10-CM | POA: Diagnosis not present

## 2023-07-30 DIAGNOSIS — R079 Chest pain, unspecified: Secondary | ICD-10-CM | POA: Diagnosis not present

## 2023-07-30 DIAGNOSIS — I10 Essential (primary) hypertension: Secondary | ICD-10-CM | POA: Diagnosis not present

## 2023-07-30 DIAGNOSIS — J029 Acute pharyngitis, unspecified: Secondary | ICD-10-CM | POA: Diagnosis present

## 2023-07-30 DIAGNOSIS — J45909 Unspecified asthma, uncomplicated: Secondary | ICD-10-CM | POA: Insufficient documentation

## 2023-07-30 LAB — CBC
HCT: 43.5 % (ref 39.0–52.0)
Hemoglobin: 15.6 g/dL (ref 13.0–17.0)
MCH: 31.8 pg (ref 26.0–34.0)
MCHC: 35.9 g/dL (ref 30.0–36.0)
MCV: 88.6 fL (ref 80.0–100.0)
Platelets: 170 10*3/uL (ref 150–400)
RBC: 4.91 MIL/uL (ref 4.22–5.81)
RDW: 12.2 % (ref 11.5–15.5)
WBC: 8 10*3/uL (ref 4.0–10.5)
nRBC: 0 % (ref 0.0–0.2)

## 2023-07-30 LAB — BASIC METABOLIC PANEL WITH GFR
Anion gap: 11 (ref 5–15)
BUN: 16 mg/dL (ref 6–20)
CO2: 24 mmol/L (ref 22–32)
Calcium: 9.2 mg/dL (ref 8.9–10.3)
Chloride: 102 mmol/L (ref 98–111)
Creatinine, Ser: 1.09 mg/dL (ref 0.61–1.24)
GFR, Estimated: >60 mL/min (ref >60)
Glucose, Bld: 119 mg/dL — ABNORMAL HIGH (ref 70–99)
Potassium: 3.5 mmol/L (ref 3.5–5.1)
Sodium: 137 mmol/L (ref 135–145)

## 2023-07-30 LAB — GROUP A STREP BY PCR: Group A Strep by PCR: NOT DETECTED

## 2023-07-30 LAB — TROPONIN I (HIGH SENSITIVITY): Troponin I (High Sensitivity): 4 ng/L (ref <18)

## 2023-07-30 MED ORDER — LIDOCAINE VISCOUS HCL 2 % MT SOLN
15.0000 mL | Freq: Once | OROMUCOSAL | Status: AC
  Administered 2023-07-30: 15 mL via OROMUCOSAL
  Filled 2023-07-30: qty 15

## 2023-07-30 MED ORDER — PREDNISONE 20 MG PO TABS
60.0000 mg | ORAL_TABLET | Freq: Once | ORAL | Status: AC
  Administered 2023-07-30: 60 mg via ORAL
  Filled 2023-07-30: qty 3

## 2023-07-30 MED ORDER — PREDNISONE 50 MG PO TABS: 50.0000 mg | ORAL_TABLET | Freq: Every day | ORAL | 0 refills | Status: AC

## 2023-07-30 NOTE — ED Triage Notes (Signed)
 He states that his throat has been hurting for the past few weeks due to the pollen. Today he noticed a lump on the left side of his neck. Pt has a swollen lymph node and he became anxious. Pt states his chest started hurting after he realized he had something swelling on the side of his neck. Pt states that it is difficult to swallow no. Pt is speaking in full sentences and vital signs are normal. Pt able to swallow on command. Denies fever at home, and no nausea or vomiting. Pt states he has severe anxiety.

## 2023-07-30 NOTE — ED Notes (Signed)
 Pt verbalizes understanding of discharge instructions.

## 2023-07-30 NOTE — Discharge Instructions (Addendum)
 Your lab work is reassuring.  Your chest x-ray and EKG revealed no acute abnormalities indicating a life-threatening injury or illness.  Please follow-up with primary care provider for further evaluation.  Return to ED if symptoms worsen.

## 2023-07-30 NOTE — ED Provider Notes (Signed)
 [facility] Emergency Department Provider Note     Event Date/Time   First MD Initiated Contact with Patient 07/30/23 1859     (approximate)   History   Chest Pain, Anxiety, and Lymphadenopathy   HPI  Ivan Estrada is a 38 y.o. male with a history of depression, asthma and HTN presents to the ED with complaint of painful swallowing x 3 days that he believes is contributed to pollen.  He reports some swelling to the left side of his neck.  He has tried nothing for pain.  He also reported that his chest began to hurt while waiting in triage but has since resolved.  He denies shortness of breath, dental pain, fever and vomiting.      Physical Exam   Triage Vital Signs: ED Triage Vitals  Encounter Vitals Group     BP 07/30/23 1840 (!) 154/87     Systolic BP Percentile --      Diastolic BP Percentile --      Pulse Rate 07/30/23 1840 87     Resp 07/30/23 1840 18     Temp 07/30/23 1840 99.2 F (37.3 C)     Temp Source 07/30/23 1840 Oral     SpO2 07/30/23 1840 99 %     Weight 07/30/23 1827 185 lb (83.9 kg)     Height 07/30/23 1827 5\' 10"  (1.778 m)     Head Circumference --      Peak Flow --      Pain Score --      Pain Loc --      Pain Education --      Exclude from Growth Chart --     Most recent vital signs: Vitals:   07/30/23 1840  BP: (!) 154/87  Pulse: 87  Resp: 18  Temp: 99.2 F (37.3 C)  SpO2: 99%    General: Alert and oriented. INAD.  Nontoxic appearing.  Lying comfortably in evaluation room.  Speaking in complete sentences without muffled voice. Skin:  Warm, dry and intact. No rashes or lesions noted.     Head:  NCAT.  Eyes:  PERRLA. EOMI.  Throat: Oropharynx clear. No erythema or exudates. Tonsils not enlarged. Uvula is midline. Neck:   No cervical spine tenderness to palpation. Full ROM without difficulty.  CV:  Good peripheral perfusion. RRR.  RESP:  Normal effort. LCTAB.    ED Results / Procedures / Treatments    Labs (all labs ordered are listed, but only abnormal results are displayed) Labs Reviewed  BASIC METABOLIC PANEL WITH GFR - Abnormal; Notable for the following components:      Result Value   Glucose, Bld 119 (*)    All other components within normal limits  GROUP A STREP BY PCR  CBC  TROPONIN I (HIGH SENSITIVITY)  TROPONIN I (HIGH SENSITIVITY)   RADIOLOGY  I personally viewed and evaluated these images as part of my medical decision making, as well as reviewing the written report by the radiologist.  ED Provider Interpretation: No acute abnormality  DG Chest 2 View Result Date: 07/30/2023 CLINICAL DATA:  Chest pain for several weeks, palpable left neck mass EXAM: CHEST - 2 VIEW COMPARISON:  04/25/2018 FINDINGS: The heart size and mediastinal contours are within normal limits. Both lungs are clear. The visualized skeletal structures are unremarkable. IMPRESSION: No active cardiopulmonary disease. Electronically Signed   By: Bobbye Burrow M.D.   On: 07/30/2023 20:33    PROCEDURES:  Critical Care performed:  No  Procedures   MEDICATIONS ORDERED IN ED: Medications  lidocaine  (XYLOCAINE ) 2 % viscous mouth solution 15 mL (has no administration in time range)  predniSONE (DELTASONE) tablet 60 mg (has no administration in time range)     IMPRESSION / MDM / ASSESSMENT AND PLAN / ED COURSE  I reviewed the triage vital signs and the nursing notes.                               38 y.o. male presents to the emergency department for evaluation and treatment of painful swallowing. See HPI for further details.   Differential diagnosis includes, but is not limited to PTA, Ludwigs's, strep pharyngitis, allergies  Patient's presentation is most consistent with acute complicated illness / injury requiring diagnostic workup.  Patient is alert and oriented.  He is hemodynamically stable.  Physical exam findings are stated above.  No airway obstruction.  Low suspicion for tonsillar  abscess as uvula is midline.  There is no noted swelling on clinical exam to his neck.  Lab work is reassuring.  Chest x-ray is normal.  EKG shows sinus tachycardia.  Strep test negative.  ED pain management of lidocaine  viscous and steroid p.o. provided.  Patient is in stable condition for discharge home and outpatient management.  Order for PCP referral obtained.  Patient is encouraged to follow-up with PCP.  ED return precautions discussed thoroughly.  Patient verbalized understanding.   FINAL CLINICAL IMPRESSION(S) / ED DIAGNOSES   Final diagnoses:  Sore throat  Chest pain, unspecified type     Rx / DC Orders   ED Discharge Orders          Ordered    predniSONE (DELTASONE) 50 MG tablet  Daily with breakfast        07/30/23 2015    Ambulatory Referral to Primary Care (Establish Care)        07/30/23 2017             Note:  This document was prepared using Dragon voice recognition software and may include unintentional dictation errors.    Phyllis Breeze, Darryon Bastin A, PA-C 07/30/23 2209    Charleen Conn, MD 07/30/23 [phone]
